# Patient Record
Sex: Female | Born: 1990 | Race: White | Hispanic: No | Marital: Single | Smoking: Never smoker
Health system: Southern US, Community
[De-identification: ages and names within clinical notes are randomized; demographics above are authoritative.]

## PROBLEM LIST (undated history)

## (undated) DIAGNOSIS — F840 Autistic disorder: Secondary | ICD-10-CM

## (undated) DIAGNOSIS — E039 Hypothyroidism, unspecified: Secondary | ICD-10-CM

---

## 2012-01-15 DIAGNOSIS — M419 Scoliosis, unspecified: Secondary | ICD-10-CM | POA: Insufficient documentation

## 2012-01-15 DIAGNOSIS — F79 Unspecified intellectual disabilities: Secondary | ICD-10-CM | POA: Insufficient documentation

## 2012-01-15 DIAGNOSIS — G40909 Epilepsy, unspecified, not intractable, without status epilepticus: Secondary | ICD-10-CM | POA: Insufficient documentation

## 2012-01-15 DIAGNOSIS — E039 Hypothyroidism, unspecified: Secondary | ICD-10-CM | POA: Insufficient documentation

## 2012-01-15 DIAGNOSIS — N251 Nephrogenic diabetes insipidus: Secondary | ICD-10-CM | POA: Insufficient documentation

## 2012-01-15 DIAGNOSIS — I341 Nonrheumatic mitral (valve) prolapse: Secondary | ICD-10-CM | POA: Insufficient documentation

## 2012-01-22 DIAGNOSIS — R1310 Dysphagia, unspecified: Secondary | ICD-10-CM | POA: Insufficient documentation

## 2012-01-22 DIAGNOSIS — K5900 Constipation, unspecified: Secondary | ICD-10-CM | POA: Insufficient documentation

## 2012-12-23 DIAGNOSIS — F840 Autistic disorder: Secondary | ICD-10-CM | POA: Insufficient documentation

## 2012-12-23 DIAGNOSIS — F319 Bipolar disorder, unspecified: Secondary | ICD-10-CM | POA: Insufficient documentation

## 2015-12-06 DIAGNOSIS — Z3041 Encounter for surveillance of contraceptive pills: Secondary | ICD-10-CM | POA: Insufficient documentation

## 2016-05-26 DIAGNOSIS — E213 Hyperparathyroidism, unspecified: Secondary | ICD-10-CM | POA: Insufficient documentation

## 2017-01-18 DIAGNOSIS — N183 Chronic kidney disease, stage 3 unspecified: Secondary | ICD-10-CM | POA: Insufficient documentation

## 2019-09-01 DIAGNOSIS — U071 COVID-19: Secondary | ICD-10-CM | POA: Insufficient documentation

## 2020-01-19 DIAGNOSIS — L511 Stevens-Johnson syndrome: Secondary | ICD-10-CM | POA: Insufficient documentation

## 2020-02-15 ENCOUNTER — Emergency Department (HOSPITAL_COMMUNITY)
Admission: EM | Admit: 2020-02-15 | Discharge: 2020-02-15 | Disposition: A | Discharge status: Home / Self Care | Payer: Medicaid Other | Attending: Emergency Medicine | Admitting: Emergency Medicine

## 2020-02-15 ENCOUNTER — Emergency Department (HOSPITAL_COMMUNITY): Payer: Medicaid Other

## 2020-02-15 DIAGNOSIS — Y92192 Bathroom in other specified residential institution as the place of occurrence of the external cause: Secondary | ICD-10-CM | POA: Insufficient documentation

## 2020-02-15 DIAGNOSIS — Y999 Unspecified external cause status: Secondary | ICD-10-CM | POA: Diagnosis not present

## 2020-02-15 DIAGNOSIS — Z043 Encounter for examination and observation following other accident: Secondary | ICD-10-CM | POA: Insufficient documentation

## 2020-02-15 DIAGNOSIS — Y92129 Unspecified place in nursing home as the place of occurrence of the external cause: Secondary | ICD-10-CM

## 2020-02-15 DIAGNOSIS — R479 Unspecified speech disturbances: Secondary | ICD-10-CM | POA: Diagnosis not present

## 2020-02-15 DIAGNOSIS — E860 Dehydration: Secondary | ICD-10-CM | POA: Diagnosis not present

## 2020-02-15 DIAGNOSIS — W1830XA Fall on same level, unspecified, initial encounter: Secondary | ICD-10-CM | POA: Diagnosis not present

## 2020-02-15 LAB — CBC WITH DIFFERENTIAL/PLATELET
Abs Immature Granulocytes: 0.01 10*3/uL (ref 0.00–0.07)
Basophils Absolute: 0 10*3/uL (ref 0.0–0.1)
Basophils Relative: 1 %
Eosinophils Absolute: 0 10*3/uL (ref 0.0–0.5)
Eosinophils Relative: 1 %
HCT: 35.3 % — ABNORMAL LOW (ref 36.0–46.0)
Hemoglobin: 11 g/dL — ABNORMAL LOW (ref 12.0–15.0)
Immature Granulocytes: 0 %
Lymphocytes Relative: 52 %
Lymphs Abs: 1.8 10*3/uL (ref 0.7–4.0)
MCH: 29.6 pg (ref 26.0–34.0)
MCHC: 31.2 g/dL (ref 30.0–36.0)
MCV: 94.9 fL (ref 80.0–100.0)
Monocytes Absolute: 0.5 10*3/uL (ref 0.1–1.0)
Monocytes Relative: 14 %
Neutro Abs: 1.1 10*3/uL — ABNORMAL LOW (ref 1.7–7.7)
Neutrophils Relative %: 32 %
Platelets: 218 10*3/uL (ref 150–400)
RBC: 3.72 MIL/uL — ABNORMAL LOW (ref 3.87–5.11)
RDW: 12.7 % (ref 11.5–15.5)
WBC: 3.4 10*3/uL — ABNORMAL LOW (ref 4.0–10.5)
nRBC: 0 % (ref 0.0–0.2)

## 2020-02-15 LAB — I-STAT CHEM 8, ED
BUN: 11 mg/dL (ref 6–20)
Calcium, Ion: 1.3 mmol/L (ref 1.15–1.40)
Chloride: 112 mmol/L — ABNORMAL HIGH (ref 98–111)
Creatinine, Ser: 1.2 mg/dL — ABNORMAL HIGH (ref 0.44–1.00)
Glucose, Bld: 65 mg/dL — ABNORMAL LOW (ref 70–99)
HCT: 29 % — ABNORMAL LOW (ref 36.0–46.0)
Hemoglobin: 9.9 g/dL — ABNORMAL LOW (ref 12.0–15.0)
Potassium: 3.8 mmol/L (ref 3.5–5.1)
Sodium: 144 mmol/L (ref 135–145)
TCO2: 24 mmol/L (ref 22–32)

## 2020-02-15 LAB — CBG MONITORING, ED: Glucose-Capillary: 78 mg/dL (ref 70–99)

## 2020-02-15 LAB — LITHIUM LEVEL: Lithium Lvl: 0.58 mmol/L — ABNORMAL LOW (ref 0.60–1.20)

## 2020-02-15 MED ORDER — SODIUM CHLORIDE 0.9 % IV BOLUS: 1000.0000 mL | Freq: Once | INTRAVENOUS | Status: DC

## 2020-02-15 MED ORDER — STERILE WATER FOR INJECTION IJ SOLN
INTRAMUSCULAR | Status: AC
  Filled 2020-02-15: qty 10

## 2020-02-15 MED ORDER — ACETAMINOPHEN 500 MG PO TABS
500.0000 mg | ORAL_TABLET | Freq: Four times a day (QID) | ORAL | 0 refills | Status: DC | PRN
Start: 2020-02-15 — End: 2024-01-19

## 2020-02-15 MED ORDER — ZIPRASIDONE MESYLATE 20 MG IM SOLR
10.0000 mg | Freq: Once | INTRAMUSCULAR | Status: AC
  Administered 2020-02-15: 10 mg via INTRAMUSCULAR
  Filled 2020-02-15: qty 20

## 2020-02-15 NOTE — ED Notes (Signed)
Reached out to facility and informed them pt was up for discharge, reports ride home will be here soon, requesting copy of AVS to be faxed, secretary to fax per request.

## 2020-02-15 NOTE — Discharge Instructions (Addendum)
You have been evaluated for your fall.  Fortunately no evidence of any significant injury were noted.  You may take Tylenol as needed for pain.  Follow-up with your doctor for further care.

## 2020-02-15 NOTE — ED Notes (Signed)
Facility numbers :  Crisis Line on call- [phone]  Coletta Memos [phone]

## 2020-02-15 NOTE — ED Notes (Signed)
Pt becoming increasingly restless, not open to redirection from nursing staff, leaving room, unable to follow simple commands. combative with nursing staff. EDP made aware.

## 2020-02-15 NOTE — ED Notes (Signed)
Pt visitor argumentative with this nurse and nursing staff, verbally abusive, not open to communication with this nurse. This nurse informed Charge nurse Clydie Braun, security consulted and to bedside to assess situation.

## 2020-02-15 NOTE — ED Triage Notes (Signed)
Pt to ED via EMS from [facility], c/o fall. Apparently pt was in the bathroom at some point today and had an unwitnessed fall, limited story due to collateral information from facility. Pt was found in kitchen by staff at facility, who report she fell in the bathroom unwitnessed. . Prior to EMS arrival facility sent pt to urgent care, where care was not received, pt back to facility and 911 was called. No obvious injuries noted by EMS. Medical history limited to pt new to current group home as of last night, Pt is non verbal , able to follow simple commands, appears to be patient baseline. Per EMS collateral information received from emergency contact.  Theresa Gill [phone]. Last VS: 104/62, 88, 16, cbg 103, 97 temp
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
## 2020-02-15 NOTE — ED Notes (Signed)
Pt d/c home per MD order. Facility member to ED to pick pt up. Pt ambulatory off unit with nursing staff. No s/s of acute distress at home.

## 2020-02-15 NOTE — ED Notes (Signed)
Pt transported to CT ?

## 2020-03-09 ENCOUNTER — Other Ambulatory Visit: Payer: Self-pay

## 2020-03-09 ENCOUNTER — Ambulatory Visit (HOSPITAL_COMMUNITY)
Admission: EM | Admit: 2020-03-09 | Discharge: 2020-03-09 | Disposition: A | Discharge status: Home / Self Care | Payer: Medicaid Other | Attending: Psychiatry | Admitting: Psychiatry

## 2020-03-09 DIAGNOSIS — F79 Unspecified intellectual disabilities: Secondary | ICD-10-CM

## 2020-03-09 DIAGNOSIS — Z1339 Encounter for screening examination for other mental health and behavioral disorders: Secondary | ICD-10-CM | POA: Insufficient documentation

## 2020-03-09 DIAGNOSIS — F840 Autistic disorder: Secondary | ICD-10-CM

## 2020-03-09 NOTE — Discharge Instructions (Signed)
Bring patient to ED for further evaluation of safety concerns. Follow up with established outpatient psychiatrist, and continue current medications as prescribed.

## 2020-03-09 NOTE — BH Assessment (Signed)
Comprehensive Clinical Assessment (CCA) Screening, Triage and Referral Note  03/09/2020 Theresa Gill [id number]   Patient is a 29 year old female with ASD and IDD who presents with Theresa Gill, [facility] staff for assessment due to reported unsafe behaviors.  Patient presents as nonverbal and is unable to communicate with staff.  She is notably calm for the most part, outside of wanting to walk in the hall at one point.  LPC and Harriett Sine, NP met with staff for collateral.  They report patient has been leaving the facility unannounced, needing close monitoring.  She also needs assistance with all ADLs.  They are concerned as she has disrobed and attempted to walk out the front door.  They also report she has banged her head on the floor earlier today.  [name]'s assistant notes patient's mother called to inform patient she was coming for a visit.  Patient has been asking to go home since this phone call.  Staff are concerned that patient will continue to be an elopement risk and with recent self-harm behavior(banging head), they are requesting assessment and possibly inpatient treatment.  Per [name], an IDD diagnosis at a nonverbal level of functioning is exclusionary criteria for this facility.  The recommendation is that patient be taken to [facility] ED for further evaluation and disposition planning.  [name] stated the patient is "immune compromised and I am not taking her to an ED."  She insists there is a gap in services for this population and she insists patient should be able to stay at [facility] for monitoring/evaluation.    Per RN, patient has not displayed any mood instability or self-harm behavior while waiting for [facility] to speak with staff.  The recommendation stands, that patient will need to be taken to the ED for further evaluation.     Visit Diagnosis:    ICD-10-CM   1. Autism spectrum disorder  F84.0   2. Intellectual disability  F79     Patient  Reported Information How did you hear about [name]? Other (Comment) ([facility] staff)   Referral name: Theresa Gill   Referral phone number: [phone]  Whom do you see for routine medical problems? No data recorded  Practice/Facility Name: No data recorded  Practice/Facility Phone Number: No data recorded  Name of Contact: No data recorded  Contact Number: No data recorded  Contact Fax Number: No data recorded  Prescriber Name: No data recorded  Prescriber Address (if known): No data recorded What Is the Reason for Your Visit/Call Today? Per staff present, patient has ben engaging in unsafe behaviors and they are having difficutly managing patient.  How Long Has This Been Causing You Problems? 1 wk - 1 month  Have You Recently Been in Any Inpatient Treatment (Hospital/Detox/Crisis Center/28-Day Program)? No   Name/Location of Program/Hospital:No data recorded  How Long Were You There? No data recorded  When Were You Discharged? No data recorded Have You Ever Received Services From [facility] Before? No   Who Do You See at [facility]? No data recorded Have You Recently Had Any Thoughts About Hurting Yourself? No data recorded  Are You Planning to Commit Suicide/Harm Yourself At This time?  No data recorded Have you Recently Had Thoughts About [facility]? No data recorded  Explanation: No data recorded Have You Used Any Alcohol or Drugs in the Past 24 Hours? No data recorded  How Long Ago Did You Use Drugs or Alcohol?  No data recorded  What Did You Use and How Much? No data recorded What Do You Feel Would Help You the Most Today? Assessment Only  Do You Currently Have a Therapist/Psychiatrist? Yes   Name of Therapist/Psychiatrist: Dr. Royal Piedra, [facility]   Have You Been Recently Discharged From Any Office Practice or Programs? No   Explanation of Discharge From Practice/Program:  No data recorded    CCA Screening Triage Referral Assessment  Type of Contact: Face-to-Face   Is this Initial or Reassessment? No data recorded  Date Telepsych consult ordered in [facility]:  No data recorded  Time Telepsych consult ordered in [facility]:  No data recorded Patient Reported Information Reviewed? No   Patient Left Without Being Seen? No   Reason for Not Completing Assessment: No data recorded Collateral Involvement: Group home staff, [name], provided collateral.  Does Patient Have a [place]? No data recorded  Name and Contact of Legal Guardian:  No data recorded If Minor and Not Living with Parent(s), Who has Custody? Per [name], guardian is [place]  Is CPS involved or ever been involved? Never  Is APS involved or ever been involved? Never  Patient Determined To Be At Risk for Harm To Self or Others Based on Review of Patient Reported Information or Presenting Complaint? No data recorded  Method: No data recorded  Availability of Means: No data recorded  Intent: No data recorded  Notification Required: No data recorded  Additional Information for Danger to Others Potential:  No data recorded  Additional Comments for Danger to Others Potential:  No data recorded  Are There Guns or Other Weapons in Your Home?  No data recorded   Types of Guns/Weapons: No data recorded   Are These Weapons Safely Secured?                              No data recorded   Who Could Verify You Are Able To Have These Secured:    No data recorded Do You Have any Outstanding Charges, Pending Court Dates, Parole/Probation? No data recorded Contacted To Inform of Risk of Harm To Self or Others: No data recorded Location of Assessment: [facility] Assessment Services  Does Patient Present under Involuntary Commitment? No   IVC Papers Initial File Date: No data recorded  [place] of Residence: [place]  Patient Currently Receiving the Following Services: Medication Management;Group Home   Determination of Need: Urgent (48 hours)    Options For Referral: Inpatient Hospitalization;Medication Management   Fransico Meadow

## 2020-03-09 NOTE — ED Provider Notes (Signed)
Behavioral Health Medical Screening Exam  Theresa Gill is a 29 y.o. female with history of ASD and IDD. Patient is brought in by Coletta Memos and group home staff from [facility]. Group home staff members report safety concerns because patient has been trying to elope from the group home. She attempts to run out the front door if she is not being monitored. She became frustrated today and banged her head against the wall. Patient seen by this provider and TTS counselor. Patient is nonverbal and sitting calmly and quietly in assessment room. She shows no visible signs of agitation or mood instability. Group home staff state that patient tried to jump out of a window but when time period is clarified, state that this occurred more than a month ago. Behavioral outburst as described earlier today sounds consistent with behavioral outbursts common with ASD and IDD diagnoses. Patient shows no signs of being at acute risk of harm to self or others. No recent suicidal or homicidal behaviors described. She is currently being seen by an outpatient psychiatrist who is prescribing medications.  Group home staff were attempting to drop patient off at this facility and had to be redirected to remain in the facility while patient was assessed. Group home staff state that patient needs to stay in the [facility] due to being a risk to herself because of attempts to elope from the group home. This Clinical research associate explained that patient would not be a candidate for treatment at [facility] due to diagnosis of ASD, IDD and being nonverbal, and that patient would need to be seen in the ED for full evaluation, treatment, and overnight observation if necessary. Group home staff are unwilling to take patient to the ED. I called and spoke with Dr. Lucianne Muss, who confirmed that patient is not a candidate for treatment in [facility] due to ASD/IDD exclusion criteria and that patient should be seen in the ED. Group home staff became agitated and  belligerent, saying that county commissioner had told them that our facility could help the patient. This Education officer, community for the confusion and reiterated that our behavioral health team could assess and treat patient in the ED. Group home staff refuse to take her to the ED "because that's no help" and state they will take her back to the group home. They also decline referrals to outpatient treatment.  Total Time spent with patient: 30 minutes  Psychiatric Specialty Exam  Presentation  General Appearance:Casual  Eye Contact:None  Speech:Slow (unintelligible)  Speech Volume:Decreased  Handedness:Right   Mood and Affect  Mood:No data recorded Affect:Flat   Thought Process  Thought Processes:No data recorded Descriptions of Associations:No data recorded Orientation:Other (comment) (UTA- nonverbal)  Thought Content:Other (comment) (UTA- nonverbal)  Hallucinations:Other (comment) (UTA- nonverbal)  Ideas of Reference:No data recorded UTA- nonverbal Suicidal Thoughts:No data recorded UTA- nonverbal Homicidal Thoughts:No data recorded UTA- nonverbal  Sensorium  Memory:No data recorded  UTA- nonverbal Judgment:No data recorded UTA- nonverbal Insight:No data recorded UTA- nonverbal  Executive Functions  Concentration:No data recorded UTA- nonverbal Attention Span:No data recorded UTA- nonverbal Recall:No data recorded UTA- nonverbal Fund of Knowledge:No data recorded UTA- nonverbal Language:No data recorded UTA- nonverbal  Psychomotor Activity  Psychomotor Activity:No data recorded UTA- nonverbal  Assets  Assets:No data recorded UTA- nonverbal  Sleep  Sleep:No data recorded UTA- nonverbal Number of hours: No data recorded UTA- nonverbal  Physical Exam: Physical Exam Vitals and nursing note reviewed.  Constitutional:      Appearance: She is well-developed.  Cardiovascular:  Rate and Rhythm: Normal rate.  Pulmonary:     Effort: Pulmonary effort is normal.   Neurological:     Mental Status: She is alert and oriented to person, place, and time.    Review of Systems  Constitutional: Negative.   Respiratory: Negative for cough and shortness of breath.   Psychiatric/Behavioral: Negative for hallucinations, substance abuse and suicidal ideas. The patient is not nervous/anxious.    Blood pressure 93/71, pulse 73, temperature (!) 97.1 F (36.2 C), temperature source Temporal, height 5' 1.02" (1.55 m), weight 106 lb (48.1 kg), SpO2 97 %. Body mass index is 20.01 kg/m.  Musculoskeletal: Strength & Muscle Tone: within normal limits Gait & Station: normal Patient leans: N/A   Recommendations:  Based on my evaluation the patient does not appear to have an emergency medical condition. Patient was recommended for transfer to [facility] ED for full evaluation, treatment, and disposition planning. Group home staff refused referral to ED and refused outpatient referrals as well. Patient is leaving facility in the care of group home staff members in no acute distress.   Aldean Baker, NP 03/09/2020, 3:08 PM

## 2020-03-24 ENCOUNTER — Emergency Department (HOSPITAL_BASED_OUTPATIENT_CLINIC_OR_DEPARTMENT_OTHER): Payer: Medicaid Other

## 2020-03-24 ENCOUNTER — Encounter (HOSPITAL_BASED_OUTPATIENT_CLINIC_OR_DEPARTMENT_OTHER): Payer: Self-pay | Admitting: Emergency Medicine

## 2020-03-24 ENCOUNTER — Emergency Department (HOSPITAL_BASED_OUTPATIENT_CLINIC_OR_DEPARTMENT_OTHER)
Admission: EM | Admit: 2020-03-24 | Discharge: 2020-03-25 | Disposition: A | Discharge status: Home / Self Care | Payer: Medicaid Other | Attending: Emergency Medicine | Admitting: Emergency Medicine

## 2020-03-24 DIAGNOSIS — S90812A Abrasion, left foot, initial encounter: Secondary | ICD-10-CM | POA: Insufficient documentation

## 2020-03-24 DIAGNOSIS — Y929 Unspecified place or not applicable: Secondary | ICD-10-CM | POA: Insufficient documentation

## 2020-03-24 DIAGNOSIS — Y939 Activity, unspecified: Secondary | ICD-10-CM | POA: Insufficient documentation

## 2020-03-24 DIAGNOSIS — Z658 Other specified problems related to psychosocial circumstances: Secondary | ICD-10-CM

## 2020-03-24 DIAGNOSIS — S90811A Abrasion, right foot, initial encounter: Secondary | ICD-10-CM | POA: Diagnosis not present

## 2020-03-24 DIAGNOSIS — X58XXXA Exposure to other specified factors, initial encounter: Secondary | ICD-10-CM | POA: Diagnosis not present

## 2020-03-24 DIAGNOSIS — Y999 Unspecified external cause status: Secondary | ICD-10-CM | POA: Diagnosis not present

## 2020-03-24 DIAGNOSIS — S99921A Unspecified injury of right foot, initial encounter: Secondary | ICD-10-CM | POA: Diagnosis present

## 2020-03-24 HISTORY — DX: Hypothyroidism, unspecified: E03.9

## 2020-03-24 HISTORY — DX: Autistic disorder: F84.0

## 2020-03-24 LAB — COMPREHENSIVE METABOLIC PANEL
ALT: 11 U/L (ref 0–44)
AST: 15 U/L (ref 15–41)
Albumin: 3.2 g/dL — ABNORMAL LOW (ref 3.5–5.0)
Alkaline Phosphatase: 60 U/L (ref 38–126)
Anion gap: 9 (ref 5–15)
BUN: 22 mg/dL — ABNORMAL HIGH (ref 6–20)
CO2: 27 mmol/L (ref 22–32)
Calcium: 8.9 mg/dL (ref 8.9–10.3)
Chloride: 107 mmol/L (ref 98–111)
Creatinine, Ser: 1.2 mg/dL — ABNORMAL HIGH (ref 0.44–1.00)
GFR calc Af Amer: >60 mL/min (ref >60)
GFR calc non Af Amer: >60 mL/min (ref >60)
Glucose, Bld: 100 mg/dL — ABNORMAL HIGH (ref 70–99)
Potassium: 4.3 mmol/L (ref 3.5–5.1)
Sodium: 143 mmol/L (ref 135–145)
Total Bilirubin: 0.3 mg/dL (ref 0.3–1.2)
Total Protein: 6.7 g/dL (ref 6.5–8.1)

## 2020-03-24 LAB — CBC WITH DIFFERENTIAL/PLATELET
Abs Immature Granulocytes: 0.03 10*3/uL (ref 0.00–0.07)
Basophils Absolute: 0 10*3/uL (ref 0.0–0.1)
Basophils Relative: 0 %
Eosinophils Absolute: 0 10*3/uL (ref 0.0–0.5)
Eosinophils Relative: 0 %
HCT: 36.6 % (ref 36.0–46.0)
Hemoglobin: 11.6 g/dL — ABNORMAL LOW (ref 12.0–15.0)
Immature Granulocytes: 0 %
Lymphocytes Relative: 33 %
Lymphs Abs: 2.5 10*3/uL (ref 0.7–4.0)
MCH: 29 pg (ref 26.0–34.0)
MCHC: 31.7 g/dL (ref 30.0–36.0)
MCV: 91.5 fL (ref 80.0–100.0)
Monocytes Absolute: 0.7 10*3/uL (ref 0.1–1.0)
Monocytes Relative: 9 %
Neutro Abs: 4.3 10*3/uL (ref 1.7–7.7)
Neutrophils Relative %: 58 %
Platelets: 215 10*3/uL (ref 150–400)
RBC: 4 MIL/uL (ref 3.87–5.11)
RDW: 12.4 % (ref 11.5–15.5)
WBC: 7.6 10*3/uL (ref 4.0–10.5)
nRBC: 0 % (ref 0.0–0.2)

## 2020-03-24 LAB — ACETAMINOPHEN LEVEL: Acetaminophen (Tylenol), Serum: <10 ug/mL — ABNORMAL LOW (ref 10–30)

## 2020-03-24 LAB — PREGNANCY, URINE: Preg Test, Ur: NEGATIVE

## 2020-03-24 LAB — SALICYLATE LEVEL: Salicylate Lvl: <7 mg/dL — ABNORMAL LOW (ref 7.0–30.0)
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
## 2020-03-24 NOTE — ED Notes (Addendum)
Abrasions to right toes cleaned with NS and bacitracin ointment placed.
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
## 2020-03-25 LAB — CARBAMAZEPINE LEVEL, TOTAL: Carbamazepine Lvl: 11.5 ug/mL (ref 4.0–12.0)

## 2020-03-25 LAB — TSH: TSH: 0.099 u[IU]/mL — ABNORMAL LOW (ref 0.350–4.500)

## 2020-03-25 LAB — LITHIUM LEVEL: Lithium Lvl: 0.38 mmol/L — ABNORMAL LOW (ref 0.60–1.20)

## 2020-03-25 LAB — T4, FREE: Free T4: 0.58 ng/dL — ABNORMAL LOW (ref 0.61–1.12)

## 2020-03-25 MED ORDER — VITAMIN D3 25 MCG PO TABS
2000.0000 [IU] | ORAL_TABLET | Freq: Every day | ORAL | Status: DC
  Administered 2020-03-25: 2000 [IU] via ORAL
  Filled 2020-03-25 (×2): qty 2

## 2020-03-25 MED ORDER — CARBAMAZEPINE ER 400 MG PO TB12
400.0000 mg | ORAL_TABLET | Freq: Every evening | ORAL | Status: DC
  Administered 2020-03-25: 400 mg via ORAL
  Filled 2020-03-25 (×2): qty 1

## 2020-03-25 MED ORDER — CARBAMAZEPINE ER 200 MG PO TB12
200.0000 mg | ORAL_TABLET | Freq: Every day | ORAL | Status: DC
  Administered 2020-03-25: 200 mg via ORAL
  Filled 2020-03-25 (×2): qty 1

## 2020-03-25 MED ORDER — LITHIUM CARBONATE ER 450 MG PO TBCR
450.0000 mg | EXTENDED_RELEASE_TABLET | Freq: Every day | ORAL | Status: DC
  Filled 2020-03-25 (×2): qty 1

## 2020-03-25 MED ORDER — PROPRANOLOL HCL 10 MG PO TABS
10.0000 mg | ORAL_TABLET | Freq: Three times a day (TID) | ORAL | Status: DC
  Administered 2020-03-25 (×2): 10 mg via ORAL
  Filled 2020-03-25 (×4): qty 1

## 2020-03-25 MED ORDER — LEVONORGESTREL-ETHINYL ESTRAD 0.1-20 MG-MCG PO TABS: 1.0000 | ORAL_TABLET | Freq: Every day | ORAL | Status: DC

## 2020-03-25 MED ORDER — LORATADINE 10 MG PO TABS
10.0000 mg | ORAL_TABLET | Freq: Every day | ORAL | Status: DC
  Administered 2020-03-25: 10 mg via ORAL
  Filled 2020-03-25: qty 1

## 2020-03-25 MED ORDER — ARIPIPRAZOLE 10 MG PO TABS
10.0000 mg | ORAL_TABLET | Freq: Every day | ORAL | Status: DC
  Administered 2020-03-25: 10 mg via ORAL
  Filled 2020-03-25 (×2): qty 1
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
## 2020-03-25 NOTE — Discharge Instructions (Addendum)
1.  Follow-up with your case manager and family physician as soon as possible.

## 2020-03-25 NOTE — ED Notes (Signed)
Delay in collecting vital signs - pt sleeping at this time and attempts to elope when awake

## 2020-03-25 NOTE — ED Notes (Signed)
Awaiting home medications delivery from main pharmacy, awaiting SW return call
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
## 2020-03-25 NOTE — ED Notes (Signed)
Attempt to call Theresa Gill no answer.

## 2020-03-25 NOTE — ED Notes (Signed)
Attempted to call QP Dorma Russell  #[phone]; rang several times and went to fax chimes

## 2020-03-25 NOTE — ED Notes (Signed)
Attempted to call patient's caregiver for med list - levels coming back abnormal. No answer at this time, HIPPA compliant VM left for callback

## 2020-03-25 NOTE — ED Notes (Signed)
Spoke with caregiver- pt has been decreasing lithium levels due to worsening kidney function. Also concerned for patient's thyroid. Reports patient does not sleep at night.

## 2020-03-25 NOTE — ED Notes (Addendum)
Caregiver Clayborn Bigness) arrived to pick patient up per Registration. Caregiver was Informed by triage RN Keli that we were in the process of evaluating the patient. Keli RN states that caregiver left her name/number for [name] to call when pt is ready to be discharged.
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
## 2020-03-25 NOTE — ED Notes (Signed)
Return call received from [facility], transition of care team.  Will contact caregiver to assess needs.

## 2020-03-25 NOTE — ED Notes (Signed)
Left confidential message for Alysha, transition of care team for follow up.

## 2020-03-25 NOTE — ED Notes (Signed)
Pts mom, legal guardian here to pick up for discharge.  In contact with care providers.

## 2020-03-25 NOTE — Progress Notes (Signed)
TOC CM attempted call to Dorinda Hill [facility] with [facility]. She provided number for pt's QP, Dorma Russell but number does not work. Left message for Dorinda Hill to give TOC CM a call back. Pt is scheduled for dc and needs to return to her [facility] until they are able to place pt in a group. Isidoro Donning RN CCM, [facility] ED TOC CM [phone]
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
## 2020-03-26 DIAGNOSIS — E042 Nontoxic multinodular goiter: Secondary | ICD-10-CM | POA: Insufficient documentation

## 2020-05-21 ENCOUNTER — Other Ambulatory Visit: Payer: Self-pay

## 2020-05-21 ENCOUNTER — Emergency Department (INDEPENDENT_AMBULATORY_CARE_PROVIDER_SITE_OTHER)
Admission: EM | Admit: 2020-05-21 | Discharge: 2020-05-21 | Disposition: A | Discharge status: Home / Self Care | Payer: Medicaid Other | Source: Home / Self Care | Attending: Family Medicine | Admitting: Family Medicine

## 2020-05-21 DIAGNOSIS — R350 Frequency of micturition: Secondary | ICD-10-CM | POA: Diagnosis not present

## 2020-05-21 LAB — POCT URINALYSIS DIP (MANUAL ENTRY)
Bilirubin, UA: NEGATIVE
Blood, UA: NEGATIVE
Glucose, UA: NEGATIVE mg/dL
Ketones, POC UA: NEGATIVE mg/dL
Leukocytes, UA: NEGATIVE
Nitrite, UA: NEGATIVE
Protein Ur, POC: NEGATIVE mg/dL
Spec Grav, UA: 1.01 (ref 1.010–1.025)
Urobilinogen, UA: 0.2 E.U./dL
pH, UA: 5.5 (ref 5.0–8.0)

## 2020-05-21 MED ORDER — CEPHALEXIN 500 MG PO CAPS
500.0000 mg | ORAL_CAPSULE | Freq: Two times a day (BID) | ORAL | 0 refills | Status: DC
Start: 2020-05-21 — End: 2024-01-19

## 2020-05-21 NOTE — ED Provider Notes (Signed)
[facility]    CSN: [id number] Arrival date & time: 05/21/20  1746      History   Chief Complaint Chief Complaint  Patient presents with  . Urinary Tract Infection    HPI Theresa Gill is a 29 y.o. female.   Patient is disabled.  Her caregiver noticed this morning that her urine was malodorous.  She has also had frequency and nocturia.  No fever, abdominal/flank pain, or nausea/vomiting.  The history is provided by the patient and a caregiver.    Past Medical History:  Diagnosis Date  . Autism   . Hypothyroidism     There are no problems to display for this patient.   History reviewed. No pertinent surgical history.  OB History   No obstetric history on file.      Home Medications    Prior to Admission medications   Medication Sig Start Date End Date Taking? Authorizing Provider  traZODone (DESYREL) 50 MG tablet Take 50 mg by mouth at bedtime. 1/2 at bedtime   Yes [provider]  acetaminophen (TYLENOL) 500 MG tablet Take 1 tablet (500 mg total) by mouth every 6 (six) hours as needed. 02/15/20   Fayrene Helper, PA-C  Acetylcysteine 600 MG CAPS Take 600 mg by mouth 2 (two) times daily.    [provider]  ARIPiprazole (ABILIFY) 5 MG tablet Take 12.5 mg by mouth daily. Taking 2.5 tablets daily= 12.5 mg    [provider]  carbamazepine (TEGRETOL XR) 200 MG 12 hr tablet Take 200 mg by mouth daily. 8 am    [provider]  cephALEXin (KEFLEX) 500 MG capsule Take 1 capsule (500 mg total) by mouth 2 (two) times daily. 05/21/20   Lattie Haw, MD  cholecalciferol (VITAMIN D3) 25 MCG (1000 UNIT) tablet Take 2,000 Units by mouth daily.    [provider]  clonazePAM (KLONOPIN) 1 MG tablet Take 1 mg by mouth 2 (two) times daily.    [provider]  diazepam (DIASTAT ACUDIAL) 10 MG GEL Place 5 mg rectally once. As needed for seizures    [provider]  food thickener (THICK IT) POWD Take 1 g by  mouth as needed (for dietary use).    [provider]  levonorgestrel-ethinyl estradiol (LARISSIA) 0.1-20 MG-MCG tablet Take 1 tablet by mouth daily.    [provider]  lithium carbonate (ESKALITH) 450 MG CR tablet Take 450 mg by mouth at bedtime.    [provider]  loratadine (CLARITIN) 10 MG tablet Take 10 mg by mouth daily.    [provider]  methimazole (TAPAZOLE) 5 MG tablet Take 5 mg by mouth daily.    [provider]  mupirocin ointment (BACTROBAN) 2 % Apply 1 application topically 3 (three) times daily.    [provider]  OLANZapine (ZYPREXA) 2.5 MG tablet Take by mouth. 03/05/20   [provider]  propranolol (INDERAL) 10 MG tablet Take 10 mg by mouth 3 (three) times daily.    [provider]  ziprasidone (GEODON) 40 MG capsule Take by mouth in the morning and at bedtime.  03/11/20   [provider]    Family History History reviewed. No pertinent family history.  Social History Social History   Tobacco Use  . Smoking status: Not on file  Vaping Use  . Vaping Use: Never used  Substance Use Topics  . Alcohol use: Not on file  . Drug use: Not on file  Allergies   Lamictal [lamotrigine]   Review of Systems Review of Systems  Constitutional: Negative for activity change, appetite change, chills, diaphoresis, fatigue and fever.  HENT: Negative.   Eyes: Negative.   Respiratory: Negative.   Cardiovascular: Negative.   Gastrointestinal: Negative.   Genitourinary: Positive for decreased urine volume, difficulty urinating, frequency and urgency. Negative for dysuria, flank pain, hematuria and pelvic pain.  Musculoskeletal: Negative.   Neurological: Negative for headaches.     Physical Exam Triage Vital Signs ED Triage Vitals  Enc Vitals Group     BP 05/21/20 1815 108/66     Pulse Rate 05/21/20 1815 75     Resp 05/21/20 1815 17     Temp 05/21/20 1815 (!) 97.4 F (36.3 C)     Temp  Source 05/21/20 1815 Axillary     SpO2 05/21/20 1815 99 %     Weight --      Height --      Head Circumference --      Peak Flow --      Pain Score 05/21/20 1816 0     Pain Loc --      Pain Edu? --      Excl. in GC? --    No data found.  Updated Vital Signs BP 108/66 (BP Location: Left Arm)   Pulse 75   Temp (!) 97.4 F (36.3 C) (Axillary)   Resp 17   SpO2 99%   Visual Acuity Right Eye Distance:   Left Eye Distance:   Bilateral Distance:    Right Eye Near:   Left Eye Near:    Bilateral Near:     Physical Exam Nursing notes and Vital Signs reviewed. Appearance:  Patient appears stated age, and in no acute distress.    Eyes:  Pupils are equal, round, and reactive to light and accomodation.  Extraocular movement is intact.  Conjunctivae are not inflamed   Pharynx:  Normal; moist mucous membranes  Neck:  Supple.  No adenopathy Lungs:  Clear to auscultation.  Breath sounds are equal.  Moving air well. Heart:  Regular rate and rhythm without murmurs, rubs, or gallops.  Abdomen:  Nontender without masses or hepatosplenomegaly.  Bowel sounds are present.  No CVA or flank tenderness.  Extremities:  No edema.  Skin:  No rash present.     UC Treatments / Results  Labs (all labs ordered are listed, but only abnormal results are displayed) Labs Reviewed  URINE CULTURE  POCT URINALYSIS DIP (MANUAL ENTRY)  Ref Range & Units 3 d ago  Color, UA yellow yellow   Clarity, UA clear clear   Glucose, UA negative mg/dL negative   Bilirubin, UA negative negative   Ketones, POC UA negative mg/dL negative   Spec Grav, UA 1.010 - 1.025 1.010   Blood, UA negative negative   pH, UA 5.0 - 8.0 5.5   Protein Ur, POC negative mg/dL negative   Urobilinogen, UA 0.2 or 1.0 E.U./dL 0.2   Nitrite, UA Negative Negative   Leukocytes, UA Negative Negative       Specimen Collected: 05/21/20 18:31 Last Resulted: 05/21/20 18:31         EKG   Radiology No results  found.  Procedures Procedures (including critical care time)  Medications Ordered in UC Medications - No data to display  Initial Impression / Assessment and Plan / UC Course  I have reviewed the triage vital signs and the nursing notes.  Pertinent labs & imaging results that were  available during my care of the patient were reviewed by me and considered in my medical decision making (see chart for details).    Urine culture pending. Begin Keflex. Followup with Family Doctor if not improved in one week.   Final Clinical Impressions(s) / UC Diagnoses   Final diagnoses:  Increased urinary frequency  Urinary frequency     Discharge Instructions     Increase fluid intake.  If symptoms become significantly worse during the night or over the weekend, proceed to the local emergency room.     ED Prescriptions    Medication Sig Dispense Auth. Provider   cephALEXin (KEFLEX) 500 MG capsule Take 1 capsule (500 mg total) by mouth 2 (two) times daily. 14 capsule Lattie Haw, MD        Lattie Haw, MD 05/24/20 [id number]

## 2020-05-21 NOTE — ED Triage Notes (Signed)
Pt here today with caregiver who says she smelled a strong odor of her urine this morning. Also a urinary frequency, with little amounts coming out.

## 2020-05-21 NOTE — Discharge Instructions (Addendum)
Increase fluid intake. °If symptoms become significantly worse during the night or over the weekend, proceed to the local emergency room.  °

## 2020-05-23 LAB — URINE CULTURE
MICRO NUMBER:: 10980068
SPECIMEN QUALITY:: ADEQUATE

## 2021-05-27 ENCOUNTER — Ambulatory Visit (HOSPITAL_COMMUNITY)
Admission: EM | Admit: 2021-05-27 | Discharge: 2021-05-27 | Disposition: A | Discharge status: Home / Self Care | Payer: Medicaid Other

## 2021-05-27 ENCOUNTER — Other Ambulatory Visit: Payer: Self-pay

## 2021-05-27 ENCOUNTER — Encounter (HOSPITAL_COMMUNITY): Payer: Self-pay | Admitting: Emergency Medicine

## 2021-05-27 DIAGNOSIS — M79602 Pain in left arm: Secondary | ICD-10-CM | POA: Diagnosis not present

## 2021-05-27 NOTE — ED Triage Notes (Signed)
Pt was in hot shower earlier and had some redness to posterior neck and left arm. Adult daycare called caregiver needing checked out and provide note that been evaluated for it. No redness during triage.

## 2021-05-27 NOTE — ED Provider Notes (Signed)
[facility]    CSN: [id number] Arrival date & time: 05/27/21  1924      History   Chief Complaint Chief Complaint  Patient presents with   Burn    HPI Theresa Gill is a 30 y.o. female.   Pt brought in by caregiver who reports she was complaining of arm pain while at adult day care earlier today.  Daycare is requiring evaluation and a note for her to return to facility tomorrow.  Care giver reports no known injury or trauma.  Pt with autism, poor historian.  Caregiver also reports pt was sitting in the shower with the hot water hitting the back of her neck, area was very red.  Requests that this be evaluated as well.    Past Medical History:  Diagnosis Date   Autism    Hypothyroidism     There are no problems to display for this patient.   History reviewed. No pertinent surgical history.  OB History   No obstetric history on file.      Home Medications    Prior to Admission medications   Medication Sig Start Date End Date Taking? Authorizing Provider  acetaminophen (TYLENOL) 500 MG tablet Take 1 tablet (500 mg total) by mouth every 6 (six) hours as needed. 02/15/20   Fayrene Helper, PA-C  Acetylcysteine 600 MG CAPS Take 600 mg by mouth 2 (two) times daily.    [provider]  ARIPiprazole (ABILIFY) 5 MG tablet Take 12.5 mg by mouth daily. Taking 2.5 tablets daily= 12.5 mg    [provider]  carbamazepine (TEGRETOL XR) 200 MG 12 hr tablet Take 200 mg by mouth daily. 8 am    [provider]  cephALEXin (KEFLEX) 500 MG capsule Take 1 capsule (500 mg total) by mouth 2 (two) times daily. 05/21/20   Lattie Haw, MD  cholecalciferol (VITAMIN D3) 25 MCG (1000 UNIT) tablet Take 2,000 Units by mouth daily.    [provider]  clonazePAM (KLONOPIN) 1 MG tablet Take 1 mg by mouth 2 (two) times daily.    [provider]  diazepam (DIASTAT ACUDIAL) 10 MG GEL Place 5 mg rectally once. As needed for seizures    [provider]  food thickener (THICK IT) POWD Take 1 g by mouth as needed (for dietary use).    [provider]  levonorgestrel-ethinyl estradiol (LARISSIA) 0.1-20 MG-MCG tablet Take 1 tablet by mouth daily.    [provider]  lithium carbonate (ESKALITH) 450 MG CR tablet Take 450 mg by mouth at bedtime.    [provider]  loratadine (CLARITIN) 10 MG tablet Take 10 mg by mouth daily.    [provider]  methimazole (TAPAZOLE) 5 MG tablet Take 5 mg by mouth daily.    [provider]  mupirocin ointment (BACTROBAN) 2 % Apply 1 application topically 3 (three) times daily.    [provider]  OLANZapine (ZYPREXA) 2.5 MG tablet Take by mouth. 03/05/20   [provider]  propranolol (INDERAL) 10 MG tablet Take 10 mg by mouth 3 (three) times daily.    [provider]  traZODone (DESYREL) 50 MG tablet Take 50 mg by mouth at bedtime. 1/2 at bedtime    [provider]  ziprasidone (GEODON) 40 MG capsule Take by mouth in the morning and at bedtime.  03/11/20   [provider]    Family History No family history on file.  Social History     Allergies  Lamictal [lamotrigine]   Review of Systems Review of Systems  Constitutional:  Negative for chills and fever.  HENT:  Negative for ear pain and sore throat.   Eyes:  Negative for pain and visual disturbance.  Respiratory:  Negative for cough and shortness of breath.   Cardiovascular:  Negative for chest pain and palpitations.  Gastrointestinal:  Negative for abdominal pain and vomiting.  Genitourinary:  Negative for dysuria and hematuria.  Musculoskeletal:  Positive for arthralgias (left arm pain). Negative for back pain.  Skin:  Negative for color change and rash.  Neurological:  Negative for seizures and syncope.  All other systems reviewed and are negative.   Physical Exam Triage Vital Signs ED Triage Vitals [05/27/21 2002]  Enc Vitals Group      BP 128/88     Pulse Rate 86     Resp 19     Temp 98.5 F (36.9 C)     Temp Source Oral     SpO2 97 %     Weight      Height      Head Circumference      Peak Flow      Pain Score      Pain Loc      Pain Edu?      Excl. in GC?    No data found.  Updated Vital Signs BP 128/88 (BP Location: Left Arm)   Pulse 86   Temp 98.5 F (36.9 C) (Oral)   Resp 19   SpO2 97%   Visual Acuity Right Eye Distance:   Left Eye Distance:   Bilateral Distance:    Right Eye Near:   Left Eye Near:    Bilateral Near:     Physical Exam Vitals and nursing note reviewed.  Constitutional:      General: She is not in acute distress.    Appearance: She is well-developed.  HENT:     Head: Normocephalic and atraumatic.  Eyes:     Conjunctiva/sclera: Conjunctivae normal.  Cardiovascular:     Rate and Rhythm: Normal rate and regular rhythm.     Heart sounds: No murmur heard. Pulmonary:     Effort: Pulmonary effort is normal. No respiratory distress.     Breath sounds: Normal breath sounds.  Abdominal:     Palpations: Abdomen is soft.     Tenderness: There is no abdominal tenderness.  Musculoskeletal:     Cervical back: Neck supple.     Comments: Full passive ROM with no pain elicited from patient.  No skin change. No swelling or redness.   Skin:    General: Skin is warm and dry.  Neurological:     Mental Status: She is alert.     UC Treatments / Results  Labs (all labs ordered are listed, but only abnormal results are displayed) Labs Reviewed - No data to display  EKG   Radiology No results found.  Procedures Procedures (including critical care time)  Medications Ordered in UC Medications - No data to display  Initial Impression / Assessment and Plan / UC Course  I have reviewed the triage vital signs and the nursing notes.  Pertinent labs & imaging results that were available during my care of the patient were reviewed by me and considered in my medical decision making  (see chart for details).     Pt with severe autism, communication limited.  Full passive ROM of left arm with no obvious discomfort, no redness or swelling noted.  Skin  exam normal, no redness or burns noted.  Note given for patient to return to adult daycare tomorrow.  Advised caregiver to continue to monitor.  Return if symptoms become worse.   Final Clinical Impressions(s) / UC Diagnoses   Final diagnoses:  Left arm pain     Discharge Instructions      No abnormal findings at this time, return for evaluation if symptoms persist or become worse.    ED Prescriptions   None    PDMP not reviewed this encounter.   Ward, Tylene Fantasia, PA-C 05/28/21 1105

## 2021-05-27 NOTE — Discharge Instructions (Signed)
No abnormal findings at this time, return for evaluation if symptoms persist or become worse.

## 2021-06-07 IMAGING — DX DG KNEE COMPLETE 4+V*L*
4 series · 4 of 4 positions shown · non-contrast
Comparison: None.

CLINICAL DATA: Pain

EXAM:
LEFT KNEE - COMPLETE 4+ VIEW

[knee ap]
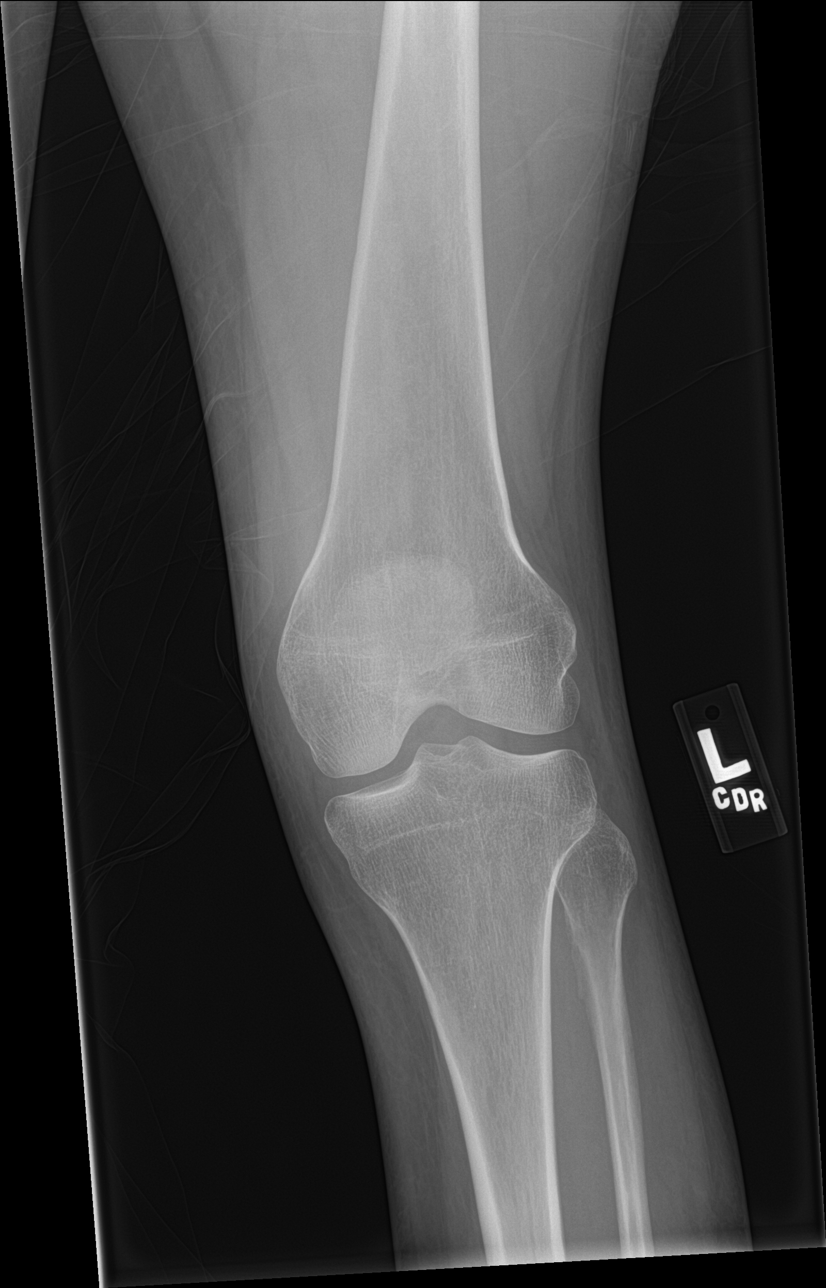

[knee lat]
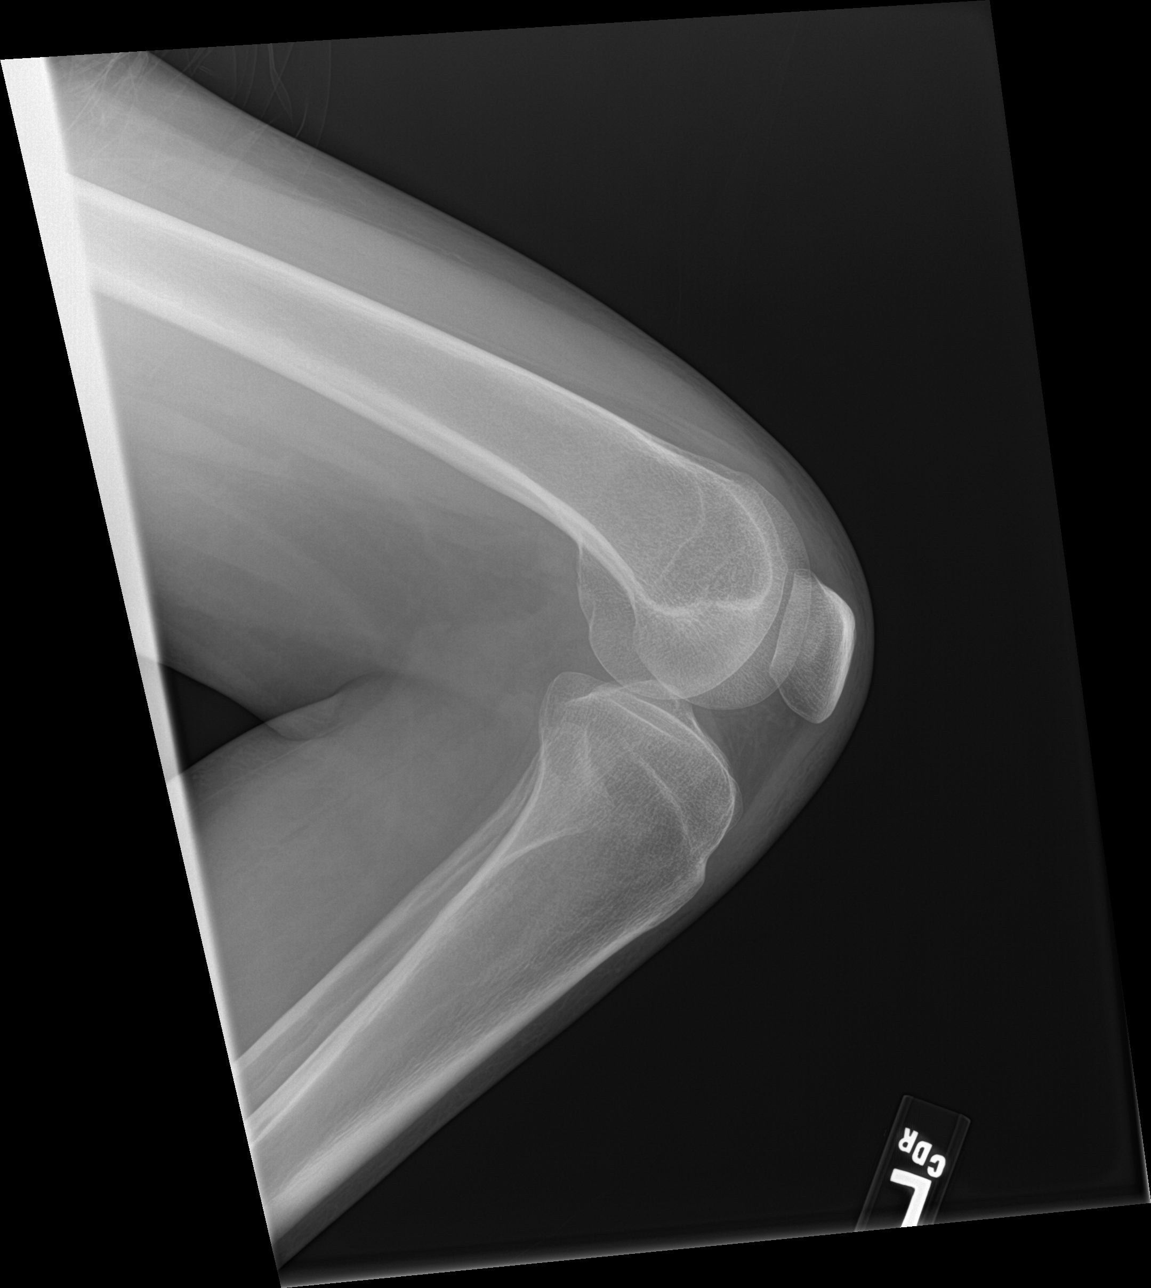

[knee obl (1 of 2)]
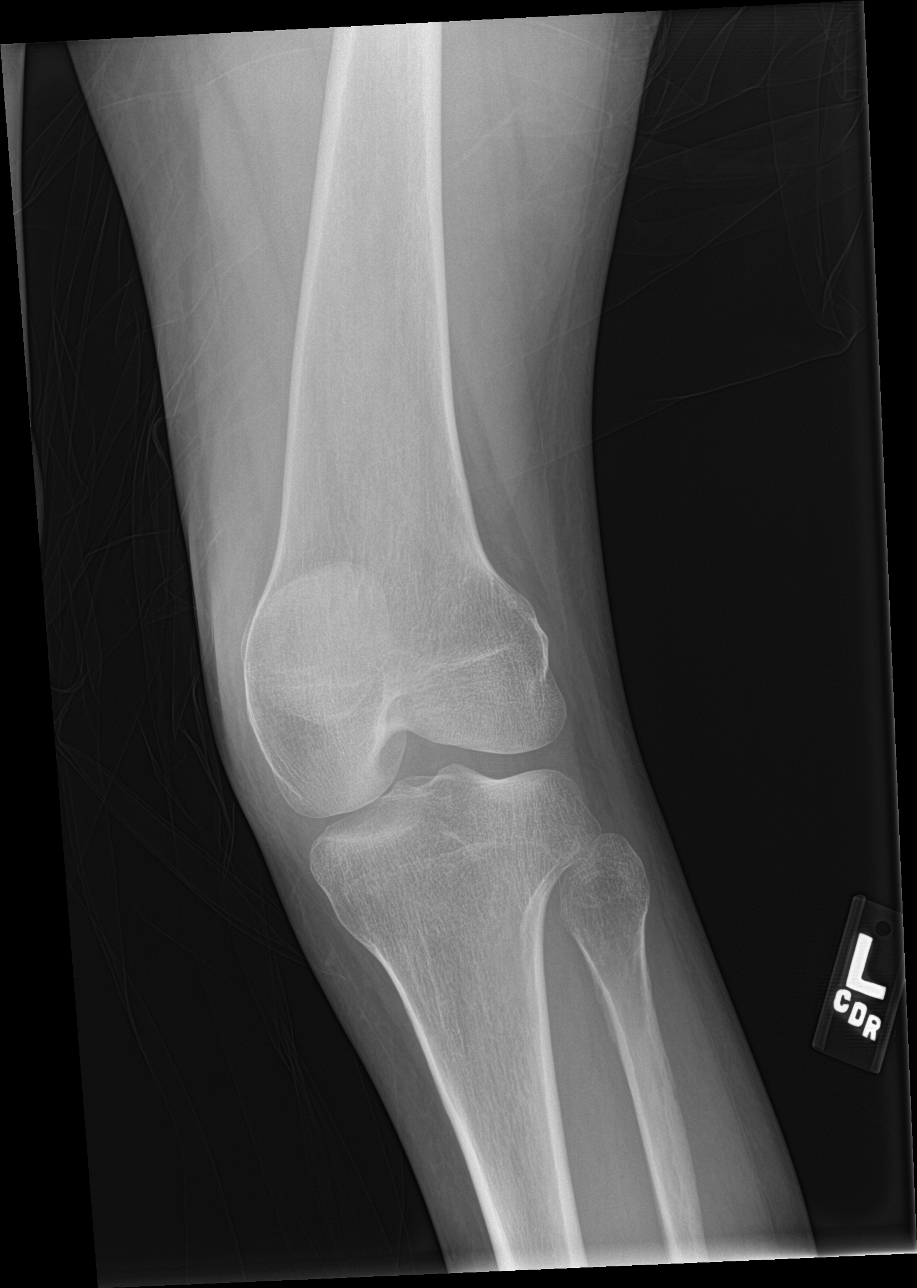

[knee obl (2 of 2)]
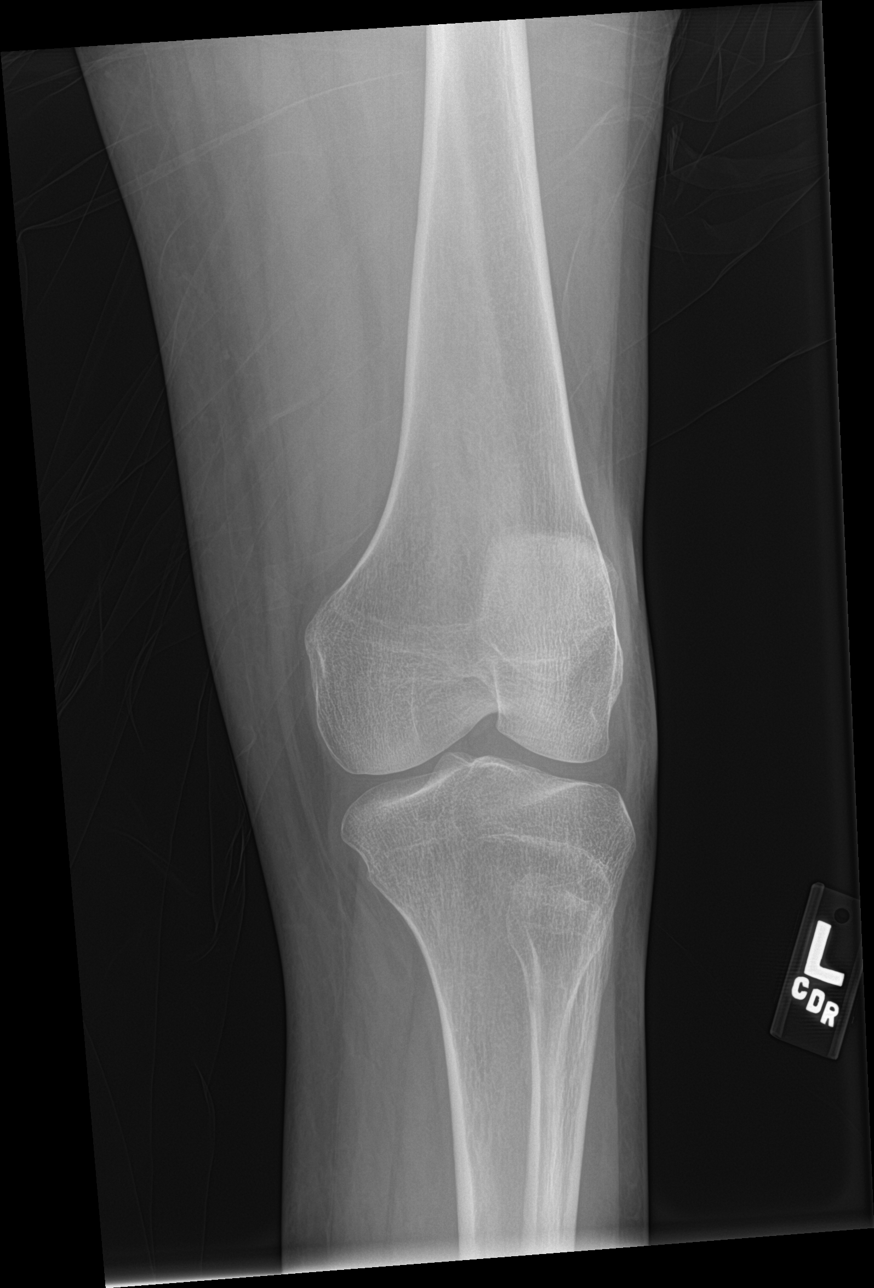

[4 of 4 positions shown; findings below may reference images not displayed]

FINDINGS: No evidence of fracture, dislocation, or joint effusion. No evidence
of arthropathy or other focal bone abnormality. Soft tissues are
unremarkable.
IMPRESSION: Negative.

## 2021-06-07 IMAGING — DX DG FOOT COMPLETE 3+V*R*
3 series · 3 of 3 positions shown · non-contrast
Comparison: None.

CLINICAL DATA: Foot pain.

EXAM:
RIGHT FOOT COMPLETE - 3+ VIEW

[foot ap]
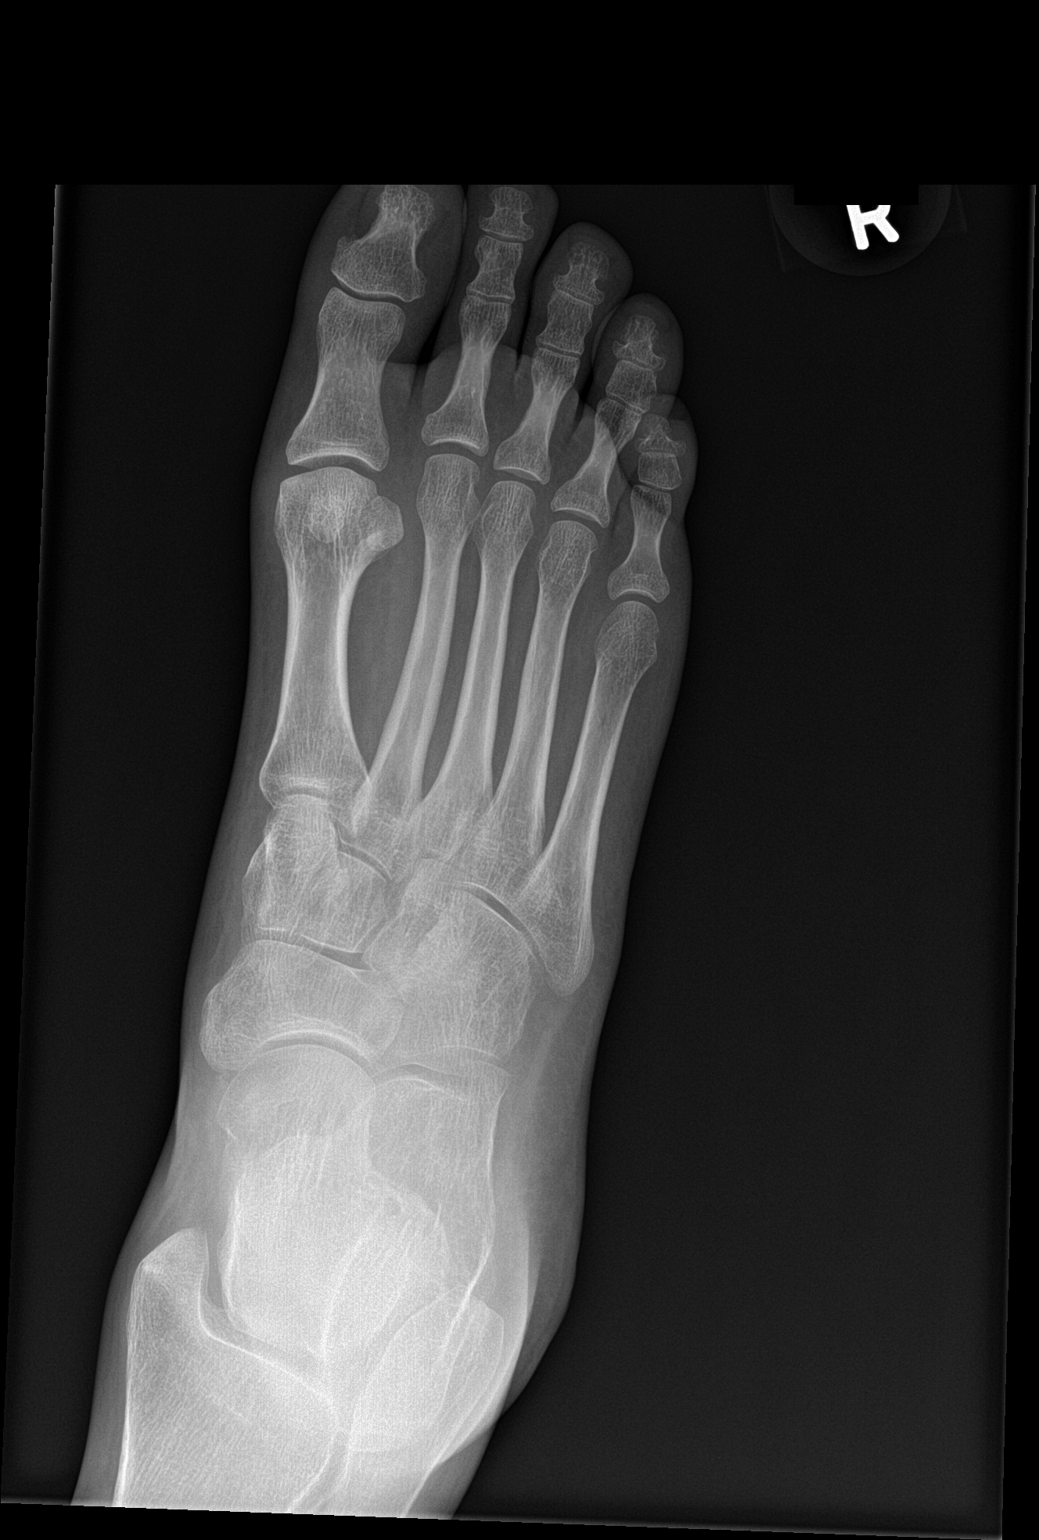

[foot obl]
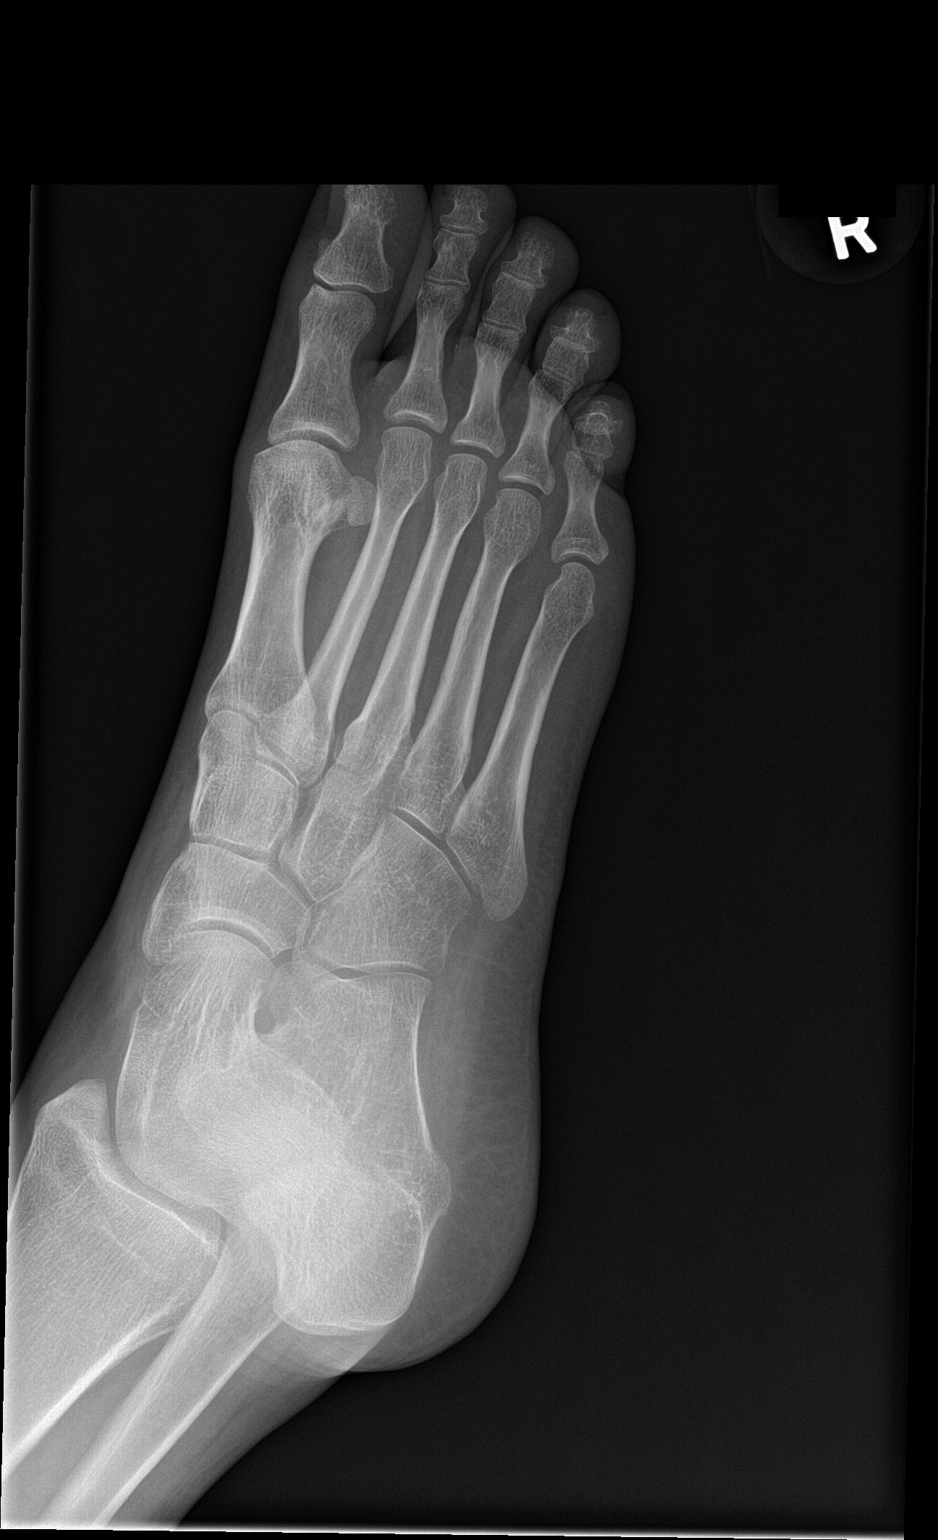

[foot lat]
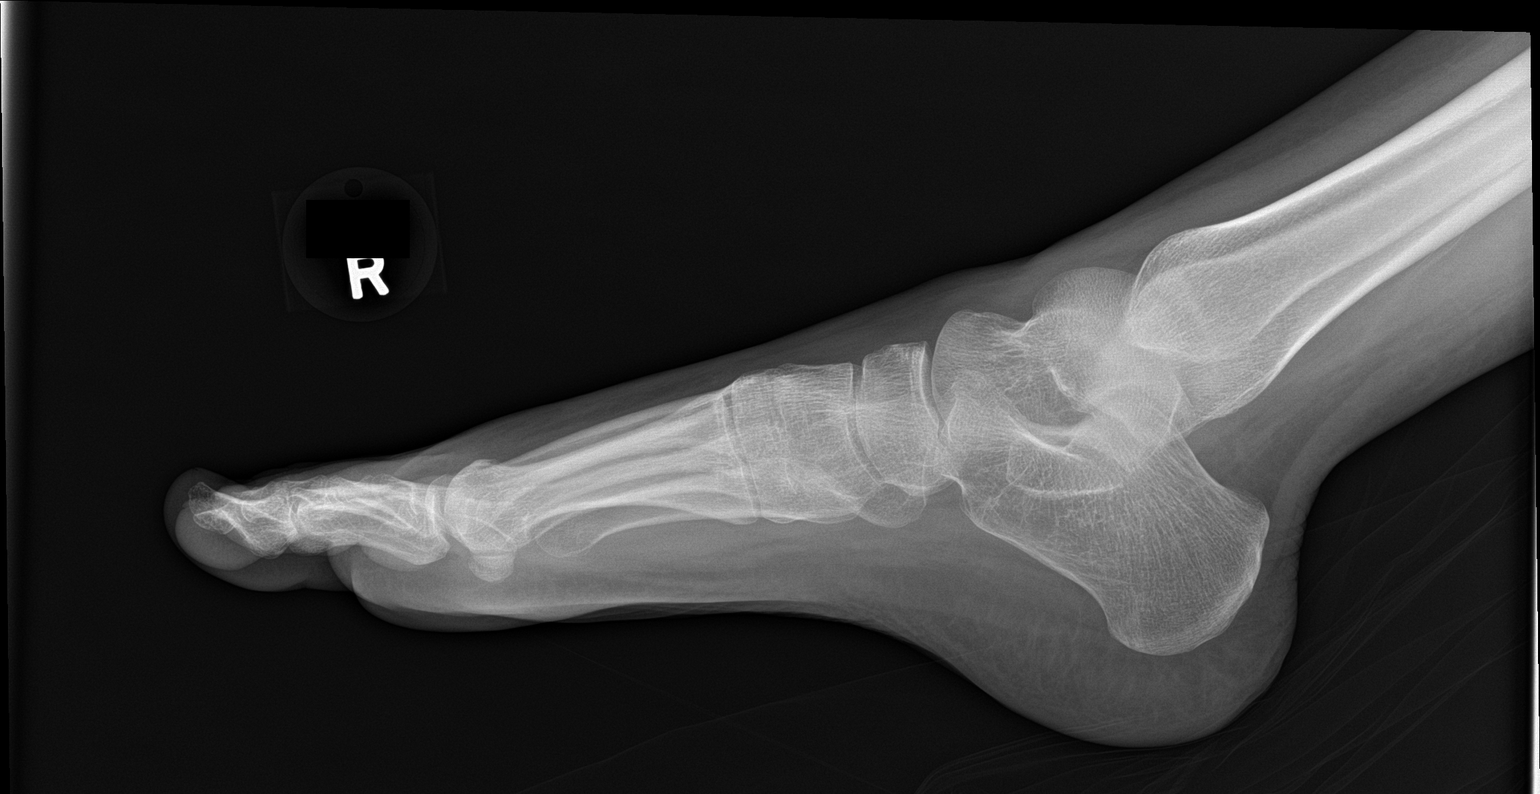

[3 of 3 positions shown; findings below may reference images not displayed]

FINDINGS: There is no evidence of fracture or dislocation. There is no
evidence of arthropathy or other focal bone abnormality. Soft
tissues are unremarkable.
IMPRESSION: Negative.

## 2024-01-19 ENCOUNTER — Ambulatory Visit: Admission: EM | Admit: 2024-01-19 | Discharge: 2024-01-19 | Disposition: A | Discharge status: Home / Self Care

## 2024-01-19 ENCOUNTER — Encounter: Payer: Self-pay | Admitting: Emergency Medicine

## 2024-01-19 DIAGNOSIS — R051 Acute cough: Secondary | ICD-10-CM

## 2024-01-19 DIAGNOSIS — J069 Acute upper respiratory infection, unspecified: Secondary | ICD-10-CM

## 2024-01-19 LAB — POCT INFLUENZA A/B
Influenza A, POC: NEGATIVE
Influenza B, POC: NEGATIVE

## 2024-01-19 LAB — POC SARS CORONAVIRUS 2 AG -  ED: SARS Coronavirus 2 Ag: NEGATIVE

## 2024-01-19 LAB — POCT RAPID STREP A (OFFICE): Rapid Strep A Screen: NEGATIVE

## 2024-01-19 MED ORDER — AMOXICILLIN-POT CLAVULANATE 875-125 MG PO TABS: 1.0000 | ORAL_TABLET | Freq: Two times a day (BID) | ORAL | 0 refills | Status: AC

## 2024-01-19 NOTE — Discharge Instructions (Signed)
 Based upon her symptoms, will go ahead with antibiotic therapy. Negative COVID, Flu, and Strep testing. May offer Tylenol , Motrin, Zyrtec, Flonase and Delsym for the cough. Ensure adequate rest and oral hydration.

## 2024-01-19 NOTE — ED Triage Notes (Signed)
 Pt is here with caregiver. Reports nasal congestion and productive cough x5 days. Unsure of fevers. No OTC cold meds given.

## 2024-01-19 NOTE — ED Provider Notes (Signed)
 [facility]    CSN: [id number] Arrival date & time: 01/19/24  1902      History   Chief Complaint Chief Complaint  Patient presents with   Nasal Congestion   Cough    HPI Theresa Gill is a 33 y.o. female.   Patient is here for evaluation of a cough and nasal congestion that has been ongoing for the past five days.  Her caregiver states that there is green nasal discharge dripping out of her nose.  No reported fever but she has had some episodic diarrhea.  No medications have been given since they weren't on the medication administration record.  No noted shortness of breath, abdominal pain, or vomiting.  The history is provided by a caregiver. The history is limited by a developmental delay.  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 No pertinent surgical history.  OB History   No obstetric history on file.      Home Medications    Prior to Admission medications   Medication Sig Start Date End Date Taking? Authorizing Provider  amoxicillin-clavulanate (AUGMENTIN)  875-125 MG tablet Take 1 tablet by mouth every 12 (twelve) hours. 01/19/24  Yes Genene Kennel, FNP  carbamazepine  (TEGRETOL -XR) 400 MG 12 hr tablet Take 400 mg by mouth. 11/05/21  Yes [provider]  clonazePAM (KLONOPIN) 0.5 MG tablet Take 1 mg by mouth. 03/18/16  Yes [provider]  levocetirizine (XYZAL) 5 MG tablet Take 1 tablet by mouth every evening. 08/02/23  Yes [provider]  linaclotide (LINZESS) 145 MCG CAPS capsule Take 145 mcg by mouth. 05/10/23  Yes [provider]  lithium  carbonate (LITHOBID ) 300 MG ER tablet Take 600 mg by mouth. 10/13/21  Yes [provider]  Misc. Devices MISC Manual wheelchair with seatbelt attachment 08/02/23  Yes [provider]  montelukast (SINGULAIR) 10 MG tablet Take 10 mg by mouth. 02/17/21  Yes [provider]  carbamazepine  (TEGRETOL  XR) 200 MG 12 hr tablet Take 200 mg by mouth daily. 8 am    [provider]  clonazePAM (KLONOPIN) 1 MG tablet Take 1 mg by mouth 2 (two) times daily.    [provider]  food thickener (THICK IT) POWD Take 1 g by mouth as needed (for dietary use).   Yes [provider]  levonorgestrel -ethinyl estradiol (LARISSIA) 0.1-20 MG-MCG tablet Take 1 tablet by mouth daily.   Yes [provider]  lithium  carbonate (ESKALITH ) 450 MG CR tablet Take 450 mg by mouth at bedtime. Patient not taking: Reported on 01/19/2024    [provider]    Family History History reviewed.  No pertinent family history.  Social History Social History   Tobacco Use   Smoking status: Never    Passive exposure: Never   Smokeless tobacco: Never  Vaping Use   Vaping status: Never Used     Allergies   Lamotrigine, Prednisone, and Sunscreens   Review of Systems Review of Systems  Constitutional:  Negative for appetite change, fatigue and fever.  HENT:  Positive for congestion and rhinorrhea. Negative for sore throat.   Respiratory:  Positive  for cough. Negative for shortness of breath.   Cardiovascular:  Negative for chest pain and palpitations.  Gastrointestinal:  Positive for diarrhea. Negative for abdominal pain, nausea and vomiting.  Genitourinary:  Negative for dysuria.  Musculoskeletal:  Negative for arthralgias and myalgias.  Skin:  Negative for rash.  Neurological:  Negative for dizziness and headaches.     Physical Exam Triage Vital Signs ED Triage Vitals [01/19/24 1954]  Encounter Vitals Group     BP      Systolic BP Percentile      Diastolic BP Percentile      Pulse Rate 95     Resp 16     Temp 98.2 F (36.8 C)     Temp Source Oral     SpO2 95 %     Weight      Height      Head Circumference      Peak Flow      Pain Score      Pain Loc      Pain Education      Exclude from Growth Chart    No data found.  Updated Vital Signs Pulse 95   Temp 98.2 F (36.8 C) (Oral)   Resp 16   SpO2 95%      Physical Exam Vitals and nursing note reviewed.  HENT:     Right Ear: Tympanic membrane and ear canal normal.     Left Ear: Tympanic membrane and ear canal normal.     Nose: Congestion and rhinorrhea present.     Mouth/Throat:     Mouth: Mucous membranes are moist.     Pharynx: Posterior oropharyngeal erythema present.  Eyes:     Conjunctiva/sclera: Conjunctivae normal.     Pupils: Pupils are equal, round, and reactive to light.  Cardiovascular:     Rate and Rhythm: Normal rate and regular rhythm.     Heart sounds: Normal heart sounds.  Pulmonary:     Effort: Pulmonary effort is normal.     Breath sounds: Normal breath sounds.  Abdominal:     General: Bowel sounds are normal.  Skin:    General: Skin is warm and dry.  Neurological:     Mental Status: She is alert. Mental status is at baseline.  Psychiatric:     Comments: Follow directions, calm, alert per normal.    UC Treatments / Results  Labs (all labs ordered are listed, but only abnormal results are displayed) Labs Reviewed  POCT  RAPID STREP A (OFFICE)  POCT INFLUENZA A/B  POC SARS CORONAVIRUS 2 AG -  ED    EKG   Radiology No results found.  Procedures Procedures (including critical care time)  Medications Ordered in UC Medications - No data to display  Initial Impression / Assessment and Plan / UC Course  I have reviewed the triage vital signs and the nursing notes.  Pertinent labs & imaging results that were available during my care of the patient were reviewed by me and  considered in my medical decision making (see chart for details).    Patient is brought in for evaluation of copious nasal discharge and a cough that has been ongoing for approximately 5 days.  Negative COVID, Flu, and Strep testing.  Based upon intensity of symptoms and her medical history, reasonable to go ahead and treat with antibiotic therapy.  May use Tylenol , Motrin, Zyrtec, Flonase, and Delsym for the symptoms.  Ensure adequate rest and oral hydration.    Final Clinical Impressions(s) / UC Diagnoses   Final diagnoses:  Acute upper respiratory infection  Acute cough     Discharge Instructions      Based upon her symptoms, will go ahead with antibiotic therapy. Negative COVID, Flu, and Strep testing. May offer Tylenol , Motrin, Zyrtec, Flonase and Delsym for the cough. Ensure adequate rest and oral hydration.   ED Prescriptions     Medication Sig Dispense Auth. Provider   amoxicillin-clavulanate (AUGMENTIN) 875-125 MG tablet Take 1 tablet by mouth every 12 (twelve) hours. 14 tablet Genene Kennel, FNP      PDMP not reviewed this encounter.   Genene Kennel, FNP 01/19/24 2028
